# Patient Record
Sex: Female | Born: 1980 | Race: White | Hispanic: No | Marital: Single | Smoking: Former smoker
Health system: Southern US, Community
[De-identification: ages and names within clinical notes are randomized; demographics above are authoritative.]

## PROBLEM LIST (undated history)

## (undated) DIAGNOSIS — I10 Essential (primary) hypertension: Secondary | ICD-10-CM

---
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
## 2002-09-30 ENCOUNTER — Emergency Department (HOSPITAL_COMMUNITY): Admission: EM | Admit: 2002-09-30 | Discharge: 2002-09-30 | Payer: Self-pay | Admitting: Emergency Medicine

## 2003-02-19 ENCOUNTER — Emergency Department (HOSPITAL_COMMUNITY): Admission: EM | Admit: 2003-02-19 | Discharge: 2003-02-19 | Payer: Self-pay | Admitting: Emergency Medicine

## 2003-02-19 ENCOUNTER — Encounter: Payer: Self-pay | Admitting: Emergency Medicine

## 2003-04-17 ENCOUNTER — Emergency Department (HOSPITAL_COMMUNITY): Admission: EM | Admit: 2003-04-17 | Discharge: 2003-04-17 | Payer: Self-pay | Admitting: *Deleted

## 2003-07-12 ENCOUNTER — Emergency Department (HOSPITAL_COMMUNITY): Admission: EM | Admit: 2003-07-12 | Discharge: 2003-07-12 | Payer: Self-pay | Admitting: Emergency Medicine

## 2003-09-16 ENCOUNTER — Emergency Department (HOSPITAL_COMMUNITY): Admission: EM | Admit: 2003-09-16 | Discharge: 2003-09-16 | Payer: Self-pay | Admitting: Emergency Medicine

## 2003-10-05 ENCOUNTER — Emergency Department (HOSPITAL_COMMUNITY): Admission: EM | Admit: 2003-10-05 | Discharge: 2003-10-05 | Payer: Self-pay | Admitting: Emergency Medicine

## 2003-10-07 ENCOUNTER — Emergency Department (HOSPITAL_COMMUNITY): Admission: EM | Admit: 2003-10-07 | Discharge: 2003-10-07 | Payer: Self-pay | Admitting: Emergency Medicine

## 2003-11-02 ENCOUNTER — Emergency Department (HOSPITAL_COMMUNITY): Admission: EM | Admit: 2003-11-02 | Discharge: 2003-11-02 | Payer: Self-pay | Admitting: Emergency Medicine

## 2004-04-02 ENCOUNTER — Emergency Department (HOSPITAL_COMMUNITY): Admission: EM | Admit: 2004-04-02 | Discharge: 2004-04-02 | Payer: Self-pay | Admitting: *Deleted

## 2004-05-09 ENCOUNTER — Inpatient Hospital Stay (HOSPITAL_COMMUNITY): Admission: AD | Admit: 2004-05-09 | Discharge: 2004-05-09 | Payer: Self-pay | Admitting: *Deleted

## 2004-05-14 ENCOUNTER — Inpatient Hospital Stay (HOSPITAL_COMMUNITY): Admission: AD | Admit: 2004-05-14 | Discharge: 2004-05-14 | Payer: Self-pay | Admitting: Obstetrics and Gynecology

## 2015-04-09 ENCOUNTER — Encounter (HOSPITAL_COMMUNITY): Payer: Self-pay | Admitting: *Deleted

## 2015-04-09 ENCOUNTER — Telehealth (HOSPITAL_COMMUNITY): Payer: Self-pay

## 2015-04-09 ENCOUNTER — Emergency Department (HOSPITAL_COMMUNITY)
Admission: EM | Admit: 2015-04-09 | Discharge: 2015-04-09 | Disposition: A | Discharge status: Home / Self Care | Payer: Medicaid - Out of State | Attending: Emergency Medicine | Admitting: Emergency Medicine

## 2015-04-09 ENCOUNTER — Emergency Department (HOSPITAL_COMMUNITY)
Admission: EM | Admit: 2015-04-09 | Discharge: 2015-04-09 | Disposition: A | Discharge status: Home / Self Care | Payer: Medicaid - Out of State | Source: Home / Self Care

## 2015-04-09 DIAGNOSIS — I10 Essential (primary) hypertension: Secondary | ICD-10-CM | POA: Diagnosis not present

## 2015-04-09 DIAGNOSIS — R220 Localized swelling, mass and lump, head: Secondary | ICD-10-CM | POA: Diagnosis present

## 2015-04-09 DIAGNOSIS — Z720 Tobacco use: Secondary | ICD-10-CM | POA: Insufficient documentation

## 2015-04-09 DIAGNOSIS — K130 Diseases of lips: Secondary | ICD-10-CM

## 2015-04-09 HISTORY — DX: Essential (primary) hypertension: I10

## 2015-04-09 LAB — CBC WITH DIFFERENTIAL/PLATELET
Basophils Absolute: 0 10*3/uL (ref 0.0–0.1)
Basophils Relative: 0 % (ref 0–1)
EOS ABS: 0 10*3/uL (ref 0.0–0.7)
EOS PCT: 0 % (ref 0–5)
HEMATOCRIT: 47.3 % — AB (ref 36.0–46.0)
HEMOGLOBIN: 15.9 g/dL — AB (ref 12.0–15.0)
Lymphocytes Relative: 21 % (ref 12–46)
Lymphs Abs: 3.3 10*3/uL (ref 0.7–4.0)
MCH: 30.3 pg (ref 26.0–34.0)
MCHC: 33.6 g/dL (ref 30.0–36.0)
MCV: 90.3 fL (ref 78.0–100.0)
MONOS PCT: 5 % (ref 3–12)
Monocytes Absolute: 0.9 10*3/uL (ref 0.1–1.0)
Neutro Abs: 11.6 10*3/uL — ABNORMAL HIGH (ref 1.7–7.7)
Neutrophils Relative %: 74 % (ref 43–77)
Platelets: 324 10*3/uL (ref 150–400)
RBC: 5.24 MIL/uL — ABNORMAL HIGH (ref 3.87–5.11)
RDW: 13.4 % (ref 11.5–15.5)
WBC: 15.9 10*3/uL — ABNORMAL HIGH (ref 4.0–10.5)

## 2015-04-09 LAB — COMPREHENSIVE METABOLIC PANEL
ALBUMIN: 3.9 g/dL (ref 3.5–5.2)
ALK PHOS: 69 U/L (ref 39–117)
ALT: 10 U/L (ref 0–35)
ANION GAP: 11 (ref 5–15)
AST: 11 U/L (ref 0–37)
BUN: 7 mg/dL (ref 6–23)
CHLORIDE: 99 mmol/L (ref 96–112)
CO2: 27 mmol/L (ref 19–32)
Calcium: 10.2 mg/dL (ref 8.4–10.5)
Creatinine, Ser: 0.81 mg/dL (ref 0.50–1.10)
GFR calc Af Amer: >90 mL/min (ref >90)
GFR calc non Af Amer: >90 mL/min (ref >90)
Glucose, Bld: 125 mg/dL — ABNORMAL HIGH (ref 70–99)
Potassium: 5.1 mmol/L (ref 3.5–5.1)
Sodium: 137 mmol/L (ref 135–145)
TOTAL PROTEIN: 8.7 g/dL — AB (ref 6.0–8.3)
Total Bilirubin: 0.7 mg/dL (ref 0.3–1.2)

## 2015-04-09 LAB — I-STAT CG4 LACTIC ACID, ED: Lactic Acid, Venous: 1.43 mmol/L (ref 0.5–2.0)

## 2015-04-09 MED ORDER — OXYCODONE-ACETAMINOPHEN 5-325 MG PO TABS
1.0000 | ORAL_TABLET | Freq: Once | ORAL | Status: AC
  Administered 2015-04-09: 1 via ORAL

## 2015-04-09 MED ORDER — LIDOCAINE HCL (PF) 1 % IJ SOLN
5.0000 mL | Freq: Once | INTRAMUSCULAR | Status: AC
  Administered 2015-04-09: 5 mL
  Filled 2015-04-09: qty 5

## 2015-04-09 MED ORDER — CLINDAMYCIN HCL 150 MG PO CAPS: 300.0000 mg | ORAL_CAPSULE | Freq: Three times a day (TID) | ORAL | Status: DC

## 2015-04-09 MED ORDER — OXYCODONE-ACETAMINOPHEN 5-325 MG PO TABS: 2.0000 | ORAL_TABLET | Freq: Four times a day (QID) | ORAL | Status: DC | PRN

## 2015-04-09 MED ORDER — CLINDAMYCIN HCL 300 MG PO CAPS
300.0000 mg | ORAL_CAPSULE | Freq: Once | ORAL | Status: AC
  Administered 2015-04-09: 300 mg via ORAL
  Filled 2015-04-09: qty 1
  Filled 2015-04-09: qty 2

## 2015-04-09 MED ORDER — OXYCODONE-ACETAMINOPHEN 5-325 MG PO TABS
ORAL_TABLET | ORAL | Status: AC
  Filled 2015-04-09: qty 1

## 2015-04-09 NOTE — Telephone Encounter (Signed)
Pt calling took Rx for Clindamycin and Percocet to [facility] [phone] to be filled but pharmacy closed before she picked med up and [phone].  Informed pt I could call Clindamycin in but unable to call Percocet in [facility]  [facility] Rx for Cl[facility]amycin but fill Percocet Rx.

## 2015-04-09 NOTE — ED Notes (Signed)
Pt stable, ambulatory, family at bedside, pain decreased to 5/10, states understanding of discharge instructions

## 2015-04-09 NOTE — ED Notes (Signed)
Pt reports possible spider bite to lower lip. Having moderate swelling x 4 days. Reports pain, had fever on day one. Airway intact at triage.

## 2015-04-09 NOTE — Discharge Instructions (Signed)

## 2015-04-09 NOTE — ED Provider Notes (Signed)
CSN: [id number]     Arrival date & time 04/09/15  1341 History   First MD Initiated Contact with Patient 04/09/15 1622     Chief Complaint  Patient presents with  . Abscess  . Oral Swelling     (Consider location/radiation/quality/duration/timing/severity/associated sxs/prior Treatment) HPI Comments: Patient presents to the emergency department with chief complaint of lip swelling. It is the lower lip only. Patient reports that she noticed a white head near her lip the other day. She took a picture of this and appears improved today. She reports associated pain. She has not taken anything to alleviate the symptoms. The pain is moderate. She denies any fevers or chills. Denies nausea, vomiting, diarrhea. It is aggravated with palpation and movement.  The history is provided by the patient. No language interpreter was used.    Past Medical History  Diagnosis Date  . Hypertension    History reviewed. No pertinent past surgical history. History reviewed. No pertinent family history. History  Substance Use Topics  . Smoking status: Current Every Day Smoker -- 2.00 packs/day    Types: Cigarettes  . Smokeless tobacco: Not on file  . Alcohol Use: No   OB History    No data available     Review of Systems  Constitutional: Negative for fever and chills.  Respiratory: Negative for shortness of breath.   Cardiovascular: Negative for chest pain.  Gastrointestinal: Negative for nausea, vomiting, diarrhea and constipation.  Genitourinary: Negative for dysuria.  Skin:       Mild swelling of lower lip with very small comedone/pustule  All other systems reviewed and are negative.     Allergies  Geodon and Bactrim  Home Medications   Prior to Admission medications   Medication Sig Start Date End Date Taking? Authorizing Provider  ibuprofen (ADVIL,MOTRIN) 200 MG tablet Take 200 mg by mouth every 6 (six) hours as needed for moderate pain.   Yes Historical Provider, MD   neomycin-bacitracin-polymyxin (NEOSPORIN) OINT Apply 1 application topically as needed for irritation or wound care.   Yes Historical Provider, MD  vitamin C (ASCORBIC ACID) 500 MG tablet Take 500 mg by mouth as needed (for immune support).   Yes Historical Provider, MD   BP 106/71 mmHg  Pulse 89  Temp(Src) 98.6 F (37 C) (Oral)  Resp 16  Ht 5\' 3"  (1.6 m)  Wt 113 lb (51.256 kg)  BMI 20.02 kg/m2  SpO2 99% Physical Exam  Constitutional: She is oriented to person, place, and time. She appears well-developed and well-nourished.  HENT:  Head: Normocephalic and atraumatic.  Mild lower lip swelling with a small comedone/pustule, no tongue involvement, no Ludwig angina, no cellulitis of the face  Eyes: Conjunctivae and EOM are normal. Pupils are equal, round, and reactive to light.  Neck: Normal range of motion. Neck supple.  Cardiovascular: Normal rate and regular rhythm.  Exam reveals no gallop and no friction rub.   No murmur heard. Pulmonary/Chest: Effort normal and breath sounds normal. No respiratory distress. She has no wheezes. She has no rales. She exhibits no tenderness.  Abdominal: Soft. Bowel sounds are normal. She exhibits no distension and no mass. There is no tenderness. There is no rebound and no guarding.  Musculoskeletal: Normal range of motion. She exhibits no edema or tenderness.  Neurological: She is alert and oriented to person, place, and time.  Skin: Skin is warm and dry.  Psychiatric: She has a normal mood and affect. Her behavior is normal. Judgment and thought content normal.  Nursing note and vitals reviewed.   ED Course  Procedures (including critical care time) Results for orders placed or performed during the hospital encounter of 04/09/15  Comprehensive metabolic panel  Result Value Ref Range   Sodium 137 135 - 145 mmol/L   Potassium 5.1 3.5 - 5.1 mmol/L   Chloride 99 96 - 112 mmol/L   CO2 27 19 - 32 mmol/L   Glucose, Bld 125 (H) 70 - 99 mg/dL   BUN 7  6 - 23 mg/dL   Creatinine, Ser 4.01 0.50 - 1.10 mg/dL   Calcium 02.7 8.4 - 25.3 mg/dL   Total Protein 8.7 (H) 6.0 - 8.3 g/dL   Albumin 3.9 3.5 - 5.2 g/dL   AST 11 0 - 37 U/L   ALT 10 0 - 35 U/L   Alkaline Phosphatase 69 39 - 117 U/L   Total Bilirubin 0.7 0.3 - 1.2 mg/dL   GFR calc non Af Amer >90 >90 mL/min   GFR calc Af Amer >90 >90 mL/min   Anion gap 11 5 - 15  CBC with Differential  Result Value Ref Range   WBC 15.9 (H) 4.0 - 10.5 K/uL   RBC 5.24 (H) 3.87 - 5.11 MIL/uL   Hemoglobin 15.9 (H) 12.0 - 15.0 g/dL   HCT 66.4 (H) 40.3 - 47.4 %   MCV 90.3 78.0 - 100.0 fL   MCH 30.3 26.0 - 34.0 pg   MCHC 33.6 30.0 - 36.0 g/dL   RDW 25.9 56.3 - 87.5 %   Platelets 324 150 - 400 K/uL   Neutrophils Relative % 74 43 - 77 %   Neutro Abs 11.6 (H) 1.7 - 7.7 K/uL   Lymphocytes Relative 21 12 - 46 %   Lymphs Abs 3.3 0.7 - 4.0 K/uL   Monocytes Relative 5 3 - 12 %   Monocytes Absolute 0.9 0.1 - 1.0 K/uL   Eosinophils Relative 0 0 - 5 %   Eosinophils Absolute 0.0 0.0 - 0.7 K/uL   Basophils Relative 0 0 - 1 %   Basophils Absolute 0.0 0.0 - 0.1 K/uL  I-Stat CG4 Lactic Acid, ED (Not at [facility] or [facility])  Result Value Ref Range   Lactic Acid, Venous 1.43 0.5 - 2.0 mmol/L   No results found.    EKG Interpretation None      MDM   Final diagnoses:  Lip abscess    Patient with lower lip swelling and possible resolving abscess. Patient's mother has a picture from 2 days ago which is significantly worse than today. The patient's course seems to be improving. Will recommend continuing warm compresses and will have spent antibiotics. At this time, shared decision-making was used to defer incision and drainage unless the swelling worsens. Patient understands and agrees to plan. Discharge home with clindamycin. Patient is stable and ready for discharge. Return precautions given.    Roxy Horseman, PA-C 04/09/15 1724  Dione Booze, MD 04/09/15 [phone]

## 2015-11-23 ENCOUNTER — Emergency Department (HOSPITAL_COMMUNITY)
Admission: EM | Admit: 2015-11-23 | Discharge: 2015-11-23 | Disposition: A | Discharge status: Home / Self Care | Payer: Medicaid - Out of State | Attending: Emergency Medicine | Admitting: Emergency Medicine

## 2015-11-23 ENCOUNTER — Encounter (HOSPITAL_COMMUNITY): Payer: Self-pay | Admitting: Emergency Medicine

## 2015-11-23 DIAGNOSIS — F17210 Nicotine dependence, cigarettes, uncomplicated: Secondary | ICD-10-CM | POA: Insufficient documentation

## 2015-11-23 DIAGNOSIS — Z3202 Encounter for pregnancy test, result negative: Secondary | ICD-10-CM | POA: Insufficient documentation

## 2015-11-23 DIAGNOSIS — I10 Essential (primary) hypertension: Secondary | ICD-10-CM | POA: Insufficient documentation

## 2015-11-23 DIAGNOSIS — M549 Dorsalgia, unspecified: Secondary | ICD-10-CM

## 2015-11-23 DIAGNOSIS — M545 Low back pain: Secondary | ICD-10-CM | POA: Insufficient documentation

## 2015-11-23 LAB — URINALYSIS, ROUTINE W REFLEX MICROSCOPIC
Bilirubin Urine: NEGATIVE
Glucose, UA: NEGATIVE mg/dL
KETONES UR: NEGATIVE mg/dL
LEUKOCYTES UA: NEGATIVE
Nitrite: NEGATIVE
PROTEIN: NEGATIVE mg/dL
Specific Gravity, Urine: 1.015 (ref 1.005–1.030)
pH: 7 (ref 5.0–8.0)

## 2015-11-23 LAB — URINE MICROSCOPIC-ADD ON

## 2015-11-23 LAB — POC URINE PREG, ED: PREG TEST UR: NEGATIVE

## 2015-11-23 MED ORDER — METHOCARBAMOL 500 MG PO TABS
500.0000 mg | ORAL_TABLET | Freq: Two times a day (BID) | ORAL | Status: DC
Start: 2015-11-23 — End: 2020-05-09

## 2015-11-23 MED ORDER — NAPROXEN 500 MG PO TABS: 500.0000 mg | ORAL_TABLET | Freq: Two times a day (BID) | ORAL | Status: DC

## 2015-11-23 NOTE — ED Notes (Signed)
Patient comes in with complaints of back pain. States pain started last week. . Patient states she is moving and have been picking up heavy boxes. Patient denies any changes to bowel or bladder. Patient ambulatory and able to move all extremities. Patient rates pain 8/10

## 2015-11-23 NOTE — Discharge Instructions (Signed)
Take the prescribed medication as directed.  Your back pain is likely due to lifting/moving boxes last week. Urine did not show any signs of infection. Follow-up with women's clinic to establish with new OB-GYN. Return to the ED for new or worsening symptoms.  Back Pain, Adult Back pain is very common in adults.The cause of back pain is rarely dangerous and the pain often gets better over time.The cause of your back pain may not be known. Some common causes of back pain include:  Strain of the muscles or ligaments supporting the spine.  Wear and tear (degeneration) of the spinal disks.  Arthritis.  Direct injury to the back. For many people, back pain may return. Since back pain is rarely dangerous, most people can learn to manage this condition on their own. HOME CARE INSTRUCTIONS Watch your back pain for any changes. The following actions may help to lessen any discomfort you are feeling:  Remain active. It is stressful on your back to sit or stand in one place for long periods of time. Do not sit, drive, or stand in one place for more than 30 minutes at a time. Take short walks on even surfaces as soon as you are able.Try to increase the length of time you walk each day.  Exercise regularly as directed by your health care provider. Exercise helps your back heal faster. It also helps avoid future injury by keeping your muscles strong and flexible.  Do not stay in bed.Resting more than 1-2 days can delay your recovery.  Pay attention to your body when you bend and lift. The most comfortable positions are those that put less stress on your recovering back. Always use proper lifting techniques, including:  Bending your knees.  Keeping the load close to your body.  Avoiding twisting.  Find a comfortable position to sleep. Use a firm mattress and lie on your side with your knees slightly bent. If you lie on your back, put a pillow under your knees.  Avoid feeling anxious or  stressed.Stress increases muscle tension and can worsen back pain.It is important to recognize when you are anxious or stressed and learn ways to manage it, such as with exercise.  Take medicines only as directed by your health care provider. Over-the-counter medicines to reduce pain and inflammation are often the most helpful.Your health care provider may prescribe muscle relaxant drugs.These medicines help dull your pain so you can more quickly return to your normal activities and healthy exercise.  Apply ice to the injured area:  Put ice in a plastic bag.  Place a towel between your skin and the bag.  Leave the ice on for 20 minutes, 2-3 times a day for the first 2-3 days. After that, ice and heat may be alternated to reduce pain and spasms.  Maintain a healthy weight. Excess weight puts extra stress on your back and makes it difficult to maintain good posture. SEEK MEDICAL CARE IF:  You have pain that is not relieved with rest or medicine.  You have increasing pain going down into the legs or buttocks.  You have pain that does not improve in one week.  You have night pain.  You lose weight.  You have a fever or chills. SEEK IMMEDIATE MEDICAL CARE IF:   You develop new bowel or bladder control problems.  You have unusual weakness or numbness in your arms or legs.  You develop nausea or vomiting.  You develop abdominal pain.  You feel faint.   This  information is not intended to replace advice given to you by your health care provider. Make sure you discuss any questions you have with your health care provider.   Document Released: 12/02/2005 Document Revised: 12/23/2014 Document Reviewed: 04/05/2014 Elsevier Interactive Patient Education [employer].

## 2015-11-23 NOTE — ED Provider Notes (Signed)
CSN: [id number]     Arrival date & time 11/23/15  [id number]   First MD Initiated Contact with Patient 11/23/15 [phone]     Chief Complaint  Patient presents with  . Back Pain     (Consider location/radiation/quality/duration/timing/severity/associated sxs/prior Treatment) Patient is a 34 y.o. female presenting with back pain. The history is provided by the patient and medical records.  Back Pain Associated symptoms: no dysuria      34 y.o. F with hx of HTN, presenting to the ED for back pain.  Patient states pain began yesterday afternoon and has been progressively worsening.  She states she did move into a new home approximately 1 week ago and was moving heavy boxes. States pain is mostly localized to her low back, described as dull and aching. She denies any bowel or bladder incontinence. No numbness or weakness of her lower extremities. No difficulty walking. Patient states she has had some urinary frequency but denies any dysuria or hematuria. Denies possibility of pregnancy, she is currently on her menstrual. No history of prior back injuries or surgeries. No intervention tried prior to arrival.  Past Medical History  Diagnosis Date  . Hypertension    History reviewed. No pertinent past surgical history. No family history on file. Social History  Substance Use Topics  . Smoking status: Current Every Day Smoker -- 2.00 packs/day    Types: Cigarettes  . Smokeless tobacco: None  . Alcohol Use: No   OB History    No data available     Review of Systems  Genitourinary: Positive for frequency. Negative for dysuria and flank pain.  Musculoskeletal: Positive for back pain.  All other systems reviewed and are negative.     Allergies  Geodon and Bactrim  Home Medications   Prior to Admission medications   Medication Sig Start Date End Date Taking? Authorizing Provider  clindamycin (CLEOCIN) 150 MG capsule Take 2 capsules (300 mg total) by mouth 3 (three) times daily. May  dispense as 150mg  capsules 04/09/15   Roxy Horsemanobert Browning, PA-C  ibuprofen (ADVIL,MOTRIN) 200 MG tablet Take 200 mg by mouth every 6 (six) hours as needed for moderate pain.    Historical Provider, MD  neomycin-bacitracin-polymyxin (NEOSPORIN) OINT Apply 1 application topically as needed for irritation or wound care.    Historical Provider, MD  oxyCODONE-acetaminophen (PERCOCET/ROXICET) 5-325 MG per tablet Take 2 tablets by mouth every 6 (six) hours as needed for severe pain. 04/09/15   Roxy Horsemanobert Browning, PA-C  vitamin C (ASCORBIC ACID) 500 MG tablet Take 500 mg by mouth as needed (for immune support).    Historical Provider, MD   BP 103/70 mmHg  Pulse 83  Temp(Src) 97.8 F (36.6 C) (Oral)  Resp 16  SpO2 100%   Physical Exam  Constitutional: She is oriented to person, place, and time. She appears well-developed and well-nourished. No distress.  HENT:  Head: Normocephalic and atraumatic.  Mouth/Throat: Oropharynx is clear and moist.  Eyes: Conjunctivae and EOM are normal. Pupils are equal, round, and reactive to light.  Neck: Normal range of motion. Neck supple.  Cardiovascular: Normal rate, regular rhythm and normal heart sounds.   Pulmonary/Chest: Effort normal and breath sounds normal. No respiratory distress. She has no wheezes.  Abdominal: Soft. Bowel sounds are normal. There is no tenderness. There is no guarding and no CVA tenderness.  Abdomen soft, nontender, no CVA tenderness  Musculoskeletal: Normal range of motion. She exhibits no edema.  Tenderness of lumbar paraspinal muscles bilaterally, no midline deformity  or step-off, full range of motion maintained, normal strength and sensation of bilateral lower extremities, normal gait  Neurological: She is alert and oriented to person, place, and time.  Skin: Skin is warm and dry. She is not diaphoretic.  Psychiatric: She has a normal mood and affect.  Nursing note and vitals reviewed.   ED Course  Procedures (including critical care  time) Labs Review Labs Reviewed  URINALYSIS, ROUTINE W REFLEX MICROSCOPIC (NOT AT [facility]) [facility]; Notable for the following:    APPearance HAZY (*)    Hgb urine dipstick LARGE (*)    All other components within normal limits  URINE MICROSCOPIC-ADD ON - Abnormal; Notable for the following:    Squamous Epithelial / LPF 0-5 (*)    Bacteria, UA RARE (*)    All other components within normal limits  POC URINE PREG, ED    Imaging Review No results found. I have personally reviewed and evaluated these images and lab results as part of my medical decision-making.   EKG Interpretation None      MDM   Final diagnoses:  Back pain, unspecified location   35 year old female here with back pain. She recently moved last week and was lifting heavy boxes. Patient is afebrile, nontoxic. She has tenderness of her lumbar paraspinal muscles without midline deformity. She has no focal neurologic deficits. No CVA tenderness or abdominal pain. She does have some urinary frequency, UA was obtained which is negative for infection.  There is blood noted however patient is currently on her menstrual.  No flank pain to suggest pyelonephritis or acute stone.  Pain is likely MSK in nature.  No red flags symptoms or neurologic deficits to suggest cauda equina, SCI, or other acute spinal pathology.  Patient we discharged with muscle relaxer and NSAIDs. She was also given referral to [facility] as she states she needs a new OB/GYN since moving here from [place].  Discussed plan with patient, he/she acknowledged understanding and agreed with plan of care.  Return precautions given for new or worsening symptoms.  Garlon Hatchet, PA-C 11/23/15 1119  Alvira Monday, MD 11/25/15 1204

## 2016-05-24 ENCOUNTER — Other Ambulatory Visit: Payer: Self-pay | Admitting: Licensed Clinical Social Worker

## 2016-09-22 ENCOUNTER — Emergency Department (HOSPITAL_COMMUNITY)
Admission: EM | Admit: 2016-09-22 | Discharge: 2016-09-22 | Disposition: A | Discharge status: Home / Self Care | Payer: Medicaid - Out of State | Attending: Emergency Medicine | Admitting: Emergency Medicine

## 2016-09-22 ENCOUNTER — Emergency Department (HOSPITAL_COMMUNITY): Payer: Medicaid - Out of State

## 2016-09-22 ENCOUNTER — Encounter (HOSPITAL_COMMUNITY): Payer: Self-pay | Admitting: Emergency Medicine

## 2016-09-22 DIAGNOSIS — I10 Essential (primary) hypertension: Secondary | ICD-10-CM | POA: Insufficient documentation

## 2016-09-22 DIAGNOSIS — Z79899 Other long term (current) drug therapy: Secondary | ICD-10-CM | POA: Insufficient documentation

## 2016-09-22 DIAGNOSIS — R1031 Right lower quadrant pain: Secondary | ICD-10-CM | POA: Insufficient documentation

## 2016-09-22 DIAGNOSIS — N939 Abnormal uterine and vaginal bleeding, unspecified: Secondary | ICD-10-CM

## 2016-09-22 DIAGNOSIS — F17210 Nicotine dependence, cigarettes, uncomplicated: Secondary | ICD-10-CM | POA: Insufficient documentation

## 2016-09-22 LAB — URINALYSIS, ROUTINE W REFLEX MICROSCOPIC
Bilirubin Urine: NEGATIVE
GLUCOSE, UA: NEGATIVE mg/dL
Hgb urine dipstick: NEGATIVE
KETONES UR: NEGATIVE mg/dL
LEUKOCYTES UA: NEGATIVE
NITRITE: NEGATIVE
PH: 6 (ref 5.0–8.0)
Protein, ur: NEGATIVE mg/dL
SPECIFIC GRAVITY, URINE: 1.023 (ref 1.005–1.030)

## 2016-09-22 LAB — COMPREHENSIVE METABOLIC PANEL
ALBUMIN: 3.8 g/dL (ref 3.5–5.0)
ALT: 12 U/L — ABNORMAL LOW (ref 14–54)
ANION GAP: 6 (ref 5–15)
AST: 14 U/L — AB (ref 15–41)
Alkaline Phosphatase: 53 U/L (ref 38–126)
BUN: 12 mg/dL (ref 6–20)
CHLORIDE: 107 mmol/L (ref 101–111)
CO2: 28 mmol/L (ref 22–32)
Calcium: 8.7 mg/dL — ABNORMAL LOW (ref 8.9–10.3)
Creatinine, Ser: 0.63 mg/dL (ref 0.44–1.00)
GFR calc Af Amer: >60 mL/min (ref >60)
GFR calc non Af Amer: >60 mL/min (ref >60)
GLUCOSE: 92 mg/dL (ref 65–99)
POTASSIUM: 4.1 mmol/L (ref 3.5–5.1)
SODIUM: 141 mmol/L (ref 135–145)
Total Bilirubin: 0.2 mg/dL — ABNORMAL LOW (ref 0.3–1.2)
Total Protein: 6.8 g/dL (ref 6.5–8.1)

## 2016-09-22 LAB — CBC
HEMATOCRIT: 38.7 % (ref 36.0–46.0)
HEMOGLOBIN: 13 g/dL (ref 12.0–15.0)
MCH: 31 pg (ref 26.0–34.0)
MCHC: 33.6 g/dL (ref 30.0–36.0)
MCV: 92.1 fL (ref 78.0–100.0)
Platelets: 305 10*3/uL (ref 150–400)
RBC: 4.2 MIL/uL (ref 3.87–5.11)
RDW: 14 % (ref 11.5–15.5)
WBC: 11.3 10*3/uL — ABNORMAL HIGH (ref 4.0–10.5)

## 2016-09-22 LAB — I-STAT BETA HCG BLOOD, ED (MC, WL, AP ONLY): I-stat hCG, quantitative: <5 m[IU]/mL (ref <5)

## 2016-09-22 LAB — LIPASE, BLOOD: LIPASE: 22 U/L (ref 11–51)

## 2016-09-22 NOTE — ED Notes (Signed)
Pt is aware of the need for urine sample, however states she is unable to provide one at this time.

## 2016-09-22 NOTE — ED Provider Notes (Signed)
[facility] Provider Note   CSN: [id number] Arrival date & time: 09/22/16  1331     History   Chief Complaint Chief Complaint  Patient presents with  . Abdominal Pain  . Vaginal Bleeding    HPI Felicia Costa is a 35 y.o. female.  HPI Patient's had vaginal bleeding for the last year. For the last few months she has had pain in her lower abdomen and to the right side. She's had nausea since. States years ago she had a LEEP but also states that she had lesions frozen off. States her vaginal bleeding has been heavier than a period. Occasional lightheadedness. The pain is dull and constant. No weight loss. Denies other vaginal discharge.  Past Medical History:  Diagnosis Date  . Hypertension     There are no active problems to display for this patient.   History reviewed. No pertinent surgical history.  OB History    No data available       Home Medications    Prior to Admission medications   Medication Sig Start Date End Date Taking? Authorizing Provider  gabapentin (NEURONTIN) 600 MG tablet Take 600 mg by mouth 3 (three) times daily.   Yes Historical Provider, MD  ibuprofen (ADVIL,MOTRIN) 200 MG tablet Take 800 mg by mouth every 6 (six) hours as needed for moderate pain.    Yes Historical Provider, MD  clindamycin (CLEOCIN) 150 MG capsule Take 2 capsules (300 mg total) by mouth 3 (three) times daily. May dispense as 150mg  capsules Patient not taking: Reported on 11/23/2015 04/09/15   Roxy Horsemanobert Browning, PA-C  methocarbamol (ROBAXIN) 500 MG tablet Take 1 tablet (500 mg total) by mouth 2 (two) times daily. Patient not taking: Reported on 09/22/2016 11/23/15   Garlon HatchetLisa M Sanders, PA-C  naproxen (NAPROSYN) 500 MG tablet Take 1 tablet (500 mg total) by mouth 2 (two) times daily with a meal. Patient not taking: Reported on 09/22/2016 11/23/15   Garlon HatchetLisa M Sanders, PA-C  oxyCODONE-acetaminophen (PERCOCET/ROXICET) 5-325 MG per tablet Take 2 tablets by mouth every 6 (six) hours as  needed for severe pain. Patient not taking: Reported on 11/23/2015 04/09/15   Roxy Horsemanobert Browning, PA-C    Family History History reviewed. No pertinent family history.  Social History Social History  Substance Use Topics  . Smoking status: Current Every Day Smoker    Packs/day: 2.00    Types: Cigarettes  . Smokeless tobacco: Not on file  . Alcohol use No     Allergies   Geodon [ziprasidone hcl] and Bactrim [sulfamethoxazole-trimethoprim]   Review of Systems Review of Systems  Constitutional: Negative for appetite change.  HENT: Negative for congestion.   Respiratory: Negative for chest tightness and shortness of breath.   Gastrointestinal: Positive for abdominal pain. Negative for blood in stool.  Genitourinary: Negative for vaginal bleeding.  Skin: Negative for rash.  Hematological: Negative for adenopathy.     Physical Exam Updated Vital Signs BP (!) 106/50 (BP Location: Right Arm)   Pulse 97   Temp 98.8 F (37.1 C) (Oral)   Resp 18   Ht 5\' 3"  (1.6 m)   Wt 136 lb (61.7 kg)   SpO2 96%   BMI 24.09 kg/m   Physical Exam  Constitutional: She appears well-developed.  Neck: Neck supple.  Cardiovascular: Normal rate.   Pulmonary/Chest: Effort normal.  Abdominal:  Some suprapubic tenderness. Possible fullness in right lower quadrant.  Musculoskeletal: Normal range of motion.  Neurological: She is alert.  Skin: No pallor.  Psychiatric: She  has a normal mood and affect.     ED Treatments / Results  Labs (all labs ordered are listed, but only abnormal results are displayed) Labs Reviewed  COMPREHENSIVE METABOLIC PANEL - Abnormal; Notable for the following:       Result Value   Calcium 8.7 (*)    AST 14 (*)    ALT 12 (*)    Total Bilirubin 0.2 (*)    All other components within normal limits  CBC - Abnormal; Notable for the following:    WBC 11.3 (*)    All other components within normal limits  LIPASE, BLOOD  URINALYSIS, ROUTINE W REFLEX MICROSCOPIC (NOT  AT [facility])  RPR  HIV ANTIBODY (ROUTINE TESTING)  I-STAT BETA HCG BLOOD, ED ([facility], [facility], [facility] ONLY)  GC/CHLAMYDIA PROBE AMP ([place]) NOT AT [facility]    EKG  EKG Interpretation None       Radiology US Transvaginal Non-ob  Result Date: 09/22/2016 CLINICAL DATA:  Right lower quadrant pain. Continuous vaginal bleeding for 1 year. EXAM: TRANSABDOMINAL AND TRANSVAGINAL ULTRASOUND OF PELVIS DOPPLER ULTRASOUND OF OVARIES TECHNIQUE: Both transabdominal and transvaginal ultrasound examinations of the pelvis were performed. Transabdominal technique was performed for global imaging of the pelvis including uterus, ovaries, adnexal regions, and pelvic cul-de-sac. It was necessary to proceed with endovaginal exam following the transabdominal exam to visualize the ovaries and endometrium. Color and duplex Doppler ultrasound was utilized to evaluate blood flow to the ovaries. COMPARISON:  None. FINDINGS: Uterus Measurements: 7.8 x 4.4 x 6.7 cm. No fibroids or other mass visualized. Endometrium Thickness: 1.6 mm.  No focal abnormality visualized. Right ovary Measurements: 3.2 x 2.2 x 2.3 cm. Normal appearance/no adnexal mass. Left ovary Measurements: 3.5 x 3.0 x 3.0 cm. Dominant 2.7 cm follicle. Pulsed Doppler evaluation of both ovaries demonstrates normal low-resistance arterial and venous waveforms. Other findings Trace fluid in the left adnexum, likely physiologic. IMPRESSION: 1. Dominant follicle in the left ovary. Minimal fluid in left adnexum, likely physiologic. No cause for right-sided pain identified. Electronically Signed   By: Gerome Sam III M.D   On: 09/22/2016 16:21   US Pelvis Complete  Result Date: 09/22/2016 CLINICAL DATA:  Right lower quadrant pain. Continuous vaginal bleeding for 1 year. EXAM: TRANSABDOMINAL AND TRANSVAGINAL ULTRASOUND OF PELVIS DOPPLER ULTRASOUND OF OVARIES TECHNIQUE: Both transabdominal and transvaginal ultrasound examinations of the pelvis were performed. Transabdominal  technique was performed for global imaging of the pelvis including uterus, ovaries, adnexal regions, and pelvic cul-de-sac. It was necessary to proceed with endovaginal exam following the transabdominal exam to visualize the ovaries and endometrium. Color and duplex Doppler ultrasound was utilized to evaluate blood flow to the ovaries. COMPARISON:  None. FINDINGS: Uterus Measurements: 7.8 x 4.4 x 6.7 cm. No fibroids or other mass visualized. Endometrium Thickness: 1.6 mm.  No focal abnormality visualized. Right ovary Measurements: 3.2 x 2.2 x 2.3 cm. Normal appearance/no adnexal mass. Left ovary Measurements: 3.5 x 3.0 x 3.0 cm. Dominant 2.7 cm follicle. Pulsed Doppler evaluation of both ovaries demonstrates normal low-resistance arterial and venous waveforms. Other findings Trace fluid in the left adnexum, likely physiologic. IMPRESSION: 1. Dominant follicle in the left ovary. Minimal fluid in left adnexum, likely physiologic. No cause for right-sided pain identified. Electronically Signed   By: Gerome Sam III M.D   On: 09/22/2016 16:21   [place] Art/ven Flow Abd Pelv Doppler  Result Date: 09/22/2016 CLINICAL DATA:  Right lower quadrant pain. Continuous vaginal bleeding for 1 year. EXAM: TRANSABDOMINAL AND TRANSVAGINAL ULTRASOUND  OF PELVIS DOPPLER ULTRASOUND OF OVARIES TECHNIQUE: Both transabdominal and transvaginal ultrasound examinations of the pelvis were performed. Transabdominal technique was performed for global imaging of the pelvis including uterus, ovaries, adnexal regions, and pelvic cul-de-sac. It was necessary to proceed with endovaginal exam following the transabdominal exam to visualize the ovaries and endometrium. Color and duplex Doppler ultrasound was utilized to evaluate blood flow to the ovaries. COMPARISON:  None. FINDINGS: Uterus Measurements: 7.8 x 4.4 x 6.7 cm. No fibroids or other mass visualized. Endometrium Thickness: 1.6 mm.  No focal abnormality visualized. Right ovary Measurements:  3.2 x 2.2 x 2.3 cm. Normal appearance/no adnexal mass. Left ovary Measurements: 3.5 x 3.0 x 3.0 cm. Dominant 2.7 cm follicle. Pulsed Doppler evaluation of both ovaries demonstrates normal low-resistance arterial and venous waveforms. Other findings Trace fluid in the left adnexum, likely physiologic. IMPRESSION: 1. Dominant follicle in the left ovary. Minimal fluid in left adnexum, likely physiologic. No cause for right-sided pain identified. Electronically Signed   By: Gerome Sam III M.D   On: 09/22/2016 16:21    Procedures Procedures (including critical care time)  Medications Ordered in ED Medications - No data to display   Initial Impression / Assessment and Plan / ED Course  I have reviewed the triage vital signs and the nursing notes.  Pertinent labs & imaging results that were available during my care of the patient were reviewed by me and considered in my medical decision making (see chart for details).  Clinical Course    Patient with vaginal bleeding and abdominal pain. Has had for several months to year. Ultrasound reassuring. Lab work overall reassuring also. Not pregnant. Patient refused pelvic exam states she just wanted to leave. States she will follow-up with the women's clinic. White count only mildly elevated. Discharge home.  Final Clinical Impressions(s) / ED Diagnoses   Final diagnoses:  Vaginal bleeding  Right lower quadrant abdominal pain    New Prescriptions New Prescriptions   No medications on file     Benjiman Core, MD 09/22/16 1652

## 2016-09-22 NOTE — ED Triage Notes (Signed)
Pt c/o progressive RLQ abdominal pain radiating into right flank x several months, nausea, continuous periods x 1 year. Hx LEEP.

## 2016-09-22 NOTE — ED Notes (Signed)
PT REFUSED PELVIC EXAM.

## 2016-09-22 NOTE — ED Notes (Signed)
Pt states that she does not want additional labs drawn because she was already tested for HIV and STDs in March

## 2016-09-22 NOTE — ED Notes (Signed)
Pt noted to be walking around the department looking for the way out. I instructed the pt to remain her room to wait for discharge paperwork however she stated "He already came in and told me everything I need to know, we are leaving". Pt exited the department

## 2018-02-02 DIAGNOSIS — F1190 Opioid use, unspecified, uncomplicated: Secondary | ICD-10-CM | POA: Insufficient documentation

## 2018-02-04 ENCOUNTER — Other Ambulatory Visit (HOSPITAL_COMMUNITY): Payer: Self-pay

## 2018-02-04 DIAGNOSIS — N644 Mastodynia: Secondary | ICD-10-CM

## 2018-03-05 ENCOUNTER — Ambulatory Visit: Payer: Medicaid - Out of State

## 2018-03-05 ENCOUNTER — Ambulatory Visit (HOSPITAL_COMMUNITY)
Admission: RE | Admit: 2018-03-05 | Discharge: 2018-03-05 | Disposition: A | Discharge status: Home / Self Care | Payer: Self-pay | Source: Ambulatory Visit | Attending: Obstetrics and Gynecology | Admitting: Obstetrics and Gynecology

## 2018-03-05 ENCOUNTER — Ambulatory Visit
Admission: RE | Admit: 2018-03-05 | Discharge: 2018-03-05 | Disposition: A | Discharge status: Home / Self Care | Payer: Medicaid - Out of State | Source: Ambulatory Visit | Attending: Obstetrics and Gynecology | Admitting: Obstetrics and Gynecology

## 2018-03-05 DIAGNOSIS — N644 Mastodynia: Secondary | ICD-10-CM

## 2018-03-05 NOTE — Progress Notes (Signed)
Patient stopped drinking caffeine around 22 days ago and is no longer having breast pain. Patient denied having any problems with her breast today. Patient not eligible for BCCCP due to being under 40 with no breast symptoms. Told patient to call if pain comes back or if she develops any breast symptoms.

## 2018-07-08 ENCOUNTER — Encounter (HOSPITAL_COMMUNITY): Payer: Self-pay | Admitting: Emergency Medicine

## 2018-07-08 ENCOUNTER — Ambulatory Visit (HOSPITAL_COMMUNITY)
Admission: EM | Admit: 2018-07-08 | Discharge: 2018-07-08 | Disposition: A | Discharge status: Home / Self Care | Payer: No Typology Code available for payment source | Attending: Family Medicine | Admitting: Family Medicine

## 2018-07-08 ENCOUNTER — Other Ambulatory Visit: Payer: Self-pay

## 2018-07-08 DIAGNOSIS — M79675 Pain in left toe(s): Secondary | ICD-10-CM

## 2018-07-08 DIAGNOSIS — L089 Local infection of the skin and subcutaneous tissue, unspecified: Secondary | ICD-10-CM

## 2018-07-08 DIAGNOSIS — L600 Ingrowing nail: Secondary | ICD-10-CM

## 2018-07-08 MED ORDER — CEPHALEXIN 500 MG PO CAPS: 500.0000 mg | ORAL_CAPSULE | Freq: Four times a day (QID) | ORAL | 0 refills | Status: DC

## 2018-07-08 MED ORDER — NAPROXEN 500 MG PO TABS: 500.0000 mg | ORAL_TABLET | Freq: Two times a day (BID) | ORAL | 0 refills | Status: DC

## 2018-07-08 NOTE — ED Triage Notes (Signed)
Left great toe pain for 6-8 months ago.  Ingrown toenail.

## 2018-07-08 NOTE — Discharge Instructions (Addendum)
It was nice meeting you!!  I am treating you for the infection in your toe.  Please keep you appointment with the podiatrist to have the ingrown toenail removed.  Continue the Epson salt soaks as you have been .  Return as needed.

## 2018-07-08 NOTE — ED Provider Notes (Signed)
[facility]    CSN: [id number] Arrival date & time: 07/08/18  1601     History   Chief Complaint Chief Complaint  Patient presents with  . Toe Pain    HPI Felicia Costa is a 37 y.o. female.   Patient is a 37 year old female that presents with 1 week of left great toe pain and swelling.  She has had ongoing issues with that toe for many months due to ingrown toenail.  The toe is very painful and swell swollen.  She reports some drainage from the toe at times.  She has been using Epson salt soaks.  She has an appointment with the podiatrist in 1 month.      Past Medical History:  Diagnosis Date  . Hypertension     There are no active problems to display for this patient.   History reviewed. No pertinent surgical history.  OB History   None      Home Medications    Prior to Admission medications   Medication Sig Start Date End Date Taking? Authorizing Provider  ibuprofen (ADVIL,MOTRIN) 200 MG tablet Take 800 mg by mouth every 6 (six) hours as needed for moderate pain.    Yes [provider]  propranolol (INDERAL) 10 MG tablet Take 10 mg by mouth 3 (three) times daily.   Yes [provider]  cephALEXin (KEFLEX) 500 MG capsule Take 1 capsule (500 mg total) by mouth 4 (four) times daily. 07/08/18   Dahlia ByesBast, Tahnee Cifuentes A, NP  clindamycin (CLEOCIN) 150 MG capsule Take 2 capsules (300 mg total) by mouth 3 (three) times daily. May dispense as 150mg  capsules Patient not taking: Reported on 11/23/2015 04/09/15   Roxy HorsemanBrowning, Robert, PA-C  gabapentin (NEURONTIN) 600 MG tablet Take 600 mg by mouth 3 (three) times daily.    [provider]  methocarbamol (ROBAXIN) 500 MG tablet Take 1 tablet (500 mg total) by mouth 2 (two) times daily. Patient not taking: Reported on 09/22/2016 11/23/15   Garlon HatchetSanders, Lisa M, PA-C  naproxen (NAPROSYN) 500 MG tablet Take 1 tablet (500 mg total) by mouth 2 (two) times daily with a meal. 07/08/18   Evann Erazo A, NP    oxyCODONE-acetaminophen (PERCOCET/ROXICET) 5-325 MG per tablet Take 2 tablets by mouth every 6 (six) hours as needed for severe pain. Patient not taking: Reported on 11/23/2015 04/09/15   Roxy HorsemanBrowning, Robert, PA-C    Family History Family History  Adopted: Yes  Family history unknown: Yes    Social History Social History   Tobacco Use  . Smoking status: Former Smoker    Packs/day: 2.00    Types: Cigarettes  Substance Use Topics  . Alcohol use: No  . Drug use: No     Allergies   Geodon [ziprasidone hcl] and Bactrim [sulfamethoxazole-trimethoprim]   Review of Systems Review of Systems  Constitutional: Negative for fatigue and fever.  Skin: Positive for rash.  Allergic/Immunologic: Negative for immunocompromised state.  Hematological: Does not bruise/bleed easily.     Physical Exam Triage Vital Signs ED Triage Vitals  Enc Vitals Group     BP 07/08/18 1633 (!) 122/58     Pulse Rate 07/08/18 1633 63     Resp 07/08/18 1633 18     Temp 07/08/18 1633 98.2 F (36.8 C)     Temp Source 07/08/18 1633 Oral     SpO2 07/08/18 1633 98 %     Weight --      Height --      Head  Circumference --      Peak Flow --      Pain Score 07/08/18 1636 8     Pain Loc --      Pain Edu? --      Excl. in GC? --    No data found.  Updated Vital Signs BP (!) 122/58 (BP Location: Right Arm)   Pulse 63   Temp 98.2 F (36.8 C) (Oral)   Resp 18   LMP 07/01/2018   SpO2 98%   Visual Acuity Right Eye Distance:   Left Eye Distance:   Bilateral Distance:    Right Eye Near:   Left Eye Near:    Bilateral Near:     Physical Exam  Constitutional: She is oriented to person, place, and time. She appears well-developed and well-nourished.  Pulmonary/Chest: Effort normal.  Neurological: She is alert and oriented to person, place, and time.  Skin: Skin is warm and dry. Capillary refill takes less than 2 seconds.  Swelling surrounding nailbed of left great toe.  Surrounding erythema and very  tender to touch.  Psychiatric: She has a normal mood and affect.  Nursing note and vitals reviewed.    UC Treatments / Results  Labs (all labs ordered are listed, but only abnormal results are displayed) Labs Reviewed - No data to display  EKG None  Radiology No results found.  Procedures Procedures (including critical care time)  Medications Ordered in UC Medications - No data to display  Initial Impression / Assessment and Plan / UC Course  I have reviewed the triage vital signs and the nursing notes.  Pertinent labs & imaging results that were available during my care of the patient were reviewed by me and considered in my medical decision making (see chart for details).     We will treat for infection in the left great toe with Keflex.  Continue Epson salt soaks and follow-up with podiatry as scheduled in 1 month. Final Clinical Impressions(s) / UC Diagnoses   Final diagnoses:  Ingrown left big toenail     Discharge Instructions     It was nice meeting you!!  I am treating you for the infection in your toe.  Please keep you appointment with the podiatrist to have the ingrown toenail removed.  Continue the Epson salt soaks as you have been .  Return as needed.     ED Prescriptions    Medication Sig Dispense Auth. Provider   cephALEXin (KEFLEX) 500 MG capsule Take 1 capsule (500 mg total) by mouth 4 (four) times daily. 40 capsule Helvi Royals A, NP   naproxen (NAPROSYN) 500 MG tablet Take 1 tablet (500 mg total) by mouth 2 (two) times daily with a meal. 30 tablet Tiajah Oyster A, NP     Controlled Substance Prescriptions [place] Controlled Substance Registry consulted? Not Applicable   Janace Aris, NP 07/08/18 1909

## 2018-08-28 ENCOUNTER — Ambulatory Visit: Payer: Self-pay

## 2018-09-03 DIAGNOSIS — F1120 Opioid dependence, uncomplicated: Secondary | ICD-10-CM | POA: Insufficient documentation

## 2018-09-29 ENCOUNTER — Encounter (HOSPITAL_COMMUNITY): Payer: Self-pay | Admitting: Emergency Medicine

## 2018-09-29 ENCOUNTER — Ambulatory Visit (INDEPENDENT_AMBULATORY_CARE_PROVIDER_SITE_OTHER): Payer: Self-pay

## 2018-09-29 ENCOUNTER — Ambulatory Visit (HOSPITAL_COMMUNITY)
Admission: EM | Admit: 2018-09-29 | Discharge: 2018-09-29 | Disposition: A | Discharge status: Home / Self Care | Payer: Self-pay | Attending: Family Medicine | Admitting: Family Medicine

## 2018-09-29 DIAGNOSIS — K5900 Constipation, unspecified: Secondary | ICD-10-CM

## 2018-09-29 MED ORDER — DOCUSATE SODIUM 100 MG PO CAPS: 100.0000 mg | ORAL_CAPSULE | Freq: Two times a day (BID) | ORAL | 0 refills | Status: DC

## 2018-09-29 MED ORDER — BISACODYL 10 MG RE SUPP: 10.0000 mg | Freq: Every day | RECTAL | 0 refills | Status: DC | PRN

## 2018-09-29 NOTE — ED Triage Notes (Signed)
PT reports abdominal pain and cramping pains for a week. PT reports nausea as well. PT usually has a BM every other day. PT reports a BM yesterday and a BM 3-4 days prior.

## 2018-09-29 NOTE — Discharge Instructions (Addendum)
Please use Miralax for moderate to severe constipation. Take this once a day for the next 2-3 days. Please also start docusate stool softener, twice a day for at least 1 week. If stools become loose, cut down to once a day for another week. If stools remain loose, cut back to 1 pill every other day for a third week. You can stop docusate thereafter and resume as needed for constipation.  May use suppository provided once daily as needed to help with constipation, please insert rectally  As alternative you may try a cleanout with MiraLAX docusate, take 1 tablet of docusate, 1 capful of MiraLAX, take a second capful 30 minutes to 1 hour later, if you have not begin to have bowel movement.  Repeat in another 30 min to 1-hour.  Please do not take more than this.  To help reduce constipation and promote bowel health: 1. Drink at least 64 ounces of water each day 2. Eat plenty of fiber (fruits, vegetables, whole grains, legumes) 3. Be physically active or exercise including walking, jogging, swimming, yoga, etc. 4. For active constipation use a stool softener (docusate) or an osmotic laxative (like Miralax) each day, or as needed.

## 2018-09-30 NOTE — ED Provider Notes (Signed)
[facility]    CSN: [id number] Arrival date & time: 09/29/18  1338     History   Chief Complaint Chief Complaint  Patient presents with  . Constipation    HPI Felicia Costa is a 37 y.o. female history of hypertension and opioid dependence using Subutex presenting today for evaluation of abdominal bloating and constipation.  Patient states that over the past 3 to 4 days she has had difficulty having bowel movements.  She typically has a bowel movement every other day, but of recently she has had less frequent bowel movements.  She has had to strain.  She did have a bowel movement yesterday after using MiraLAX, but still feels the need to use the restroom.  She is also tried mag citrate earlier today and feels this worsened her abdominal discomfort and bloating.  She has had some nausea, but denies vomiting.  Denies fevers.  Denies associated URI symptoms.  Patient is concerned that she has a family history of her mother having polyps and aunt passing away from colon cancer after "not having a bowel movement for a month".  Patient denies bleeding with bowel movements.  Patient has not seen gastroenterology.  HPI  Past Medical History:  Diagnosis Date  . Hypertension     There are no active problems to display for this patient.   History reviewed. No pertinent surgical history.  OB History   None      Home Medications    Prior to Admission medications   Medication Sig Start Date End Date Taking? Authorizing Provider  buprenorphine (SUBUTEX) 2 MG SUBL SL tablet Place under the tongue daily.   Yes [provider]  gabapentin (NEURONTIN) 600 MG tablet Take 600 mg by mouth 3 (three) times daily.   Yes [provider]  propranolol (INDERAL) 10 MG tablet Take 10 mg by mouth 3 (three) times daily.   Yes [provider]  bisacodyl (DULCOLAX) 10 MG suppository Place 1 suppository (10 mg total) rectally daily as needed for moderate  constipation. 09/29/18   Keyira Mondesir C, PA-C  docusate sodium (COLACE) 100 MG capsule Take 1 capsule (100 mg total) by mouth every 12 (twelve) hours. 09/29/18   Andrey Hoobler C, PA-C  ibuprofen (ADVIL,MOTRIN) 200 MG tablet Take 800 mg by mouth every 6 (six) hours as needed for moderate pain.     [provider]  methocarbamol (ROBAXIN) 500 MG tablet Take 1 tablet (500 mg total) by mouth 2 (two) times daily. Patient not taking: Reported on 09/22/2016 11/23/15   Garlon Hatchet, PA-C  naproxen (NAPROSYN) 500 MG tablet Take 1 tablet (500 mg total) by mouth 2 (two) times daily with a meal. 07/08/18   Bast, Traci A, NP  oxyCODONE-acetaminophen (PERCOCET/ROXICET) 5-325 MG per tablet Take 2 tablets by mouth every 6 (six) hours as needed for severe pain. Patient not taking: Reported on 11/23/2015 04/09/15   Roxy Horseman, PA-C    Family History Family History  Adopted: Yes  Family history unknown: Yes    Social History Social History   Tobacco Use  . Smoking status: Former Smoker    Packs/day: 2.00    Types: Cigarettes  Substance Use Topics  . Alcohol use: No  . Drug use: No     Allergies   Geodon [ziprasidone hcl]; Bactrim [sulfamethoxazole-trimethoprim]; and Narcan [naloxone]   Review of Systems Review of Systems  Constitutional: Negative for activity change, appetite change, chills, fatigue and fever.  HENT: Negative for congestion, ear  pain, rhinorrhea, sinus pressure, sore throat and trouble swallowing.   Eyes: Negative for discharge and redness.  Respiratory: Negative for cough, chest tightness and shortness of breath.   Cardiovascular: Negative for chest pain.  Gastrointestinal: Positive for abdominal pain and constipation. Negative for blood in stool, diarrhea, nausea and vomiting.  Musculoskeletal: Negative for myalgias.  Skin: Negative for rash.  Neurological: Negative for dizziness, light-headedness and headaches.     Physical Exam Triage Vital  Signs ED Triage Vitals  Enc Vitals Group     BP 09/29/18 1414 107/74     Pulse Rate 09/29/18 1413 92     Resp 09/29/18 1413 16     Temp 09/29/18 1413 98.1 F (36.7 C)     Temp Source 09/29/18 1413 Oral     SpO2 09/29/18 1413 98 %     Weight 09/29/18 1411 145 lb (65.8 kg)     Height --      Head Circumference --      Peak Flow --      Pain Score 09/29/18 1411 3     Pain Loc --      Pain Edu? --      Excl. in GC? --    No data found.  Updated Vital Signs BP 107/74   Pulse 92   Temp 98.1 F (36.7 C) (Oral)   Resp 16   Wt 145 lb (65.8 kg)   LMP 09/22/2018   SpO2 98%   BMI 25.69 kg/m   Visual Acuity Right Eye Distance:   Left Eye Distance:   Bilateral Distance:    Right Eye Near:   Left Eye Near:    Bilateral Near:     Physical Exam  Constitutional: She appears well-developed and well-nourished. No distress.  HENT:  Head: Normocephalic and atraumatic.  Eyes: Conjunctivae are normal.  Neck: Neck supple.  Cardiovascular: Normal rate and regular rhythm.  No murmur heard. Pulmonary/Chest: Effort normal. No respiratory distress.  Breathing comfortably at rest, CTABL, wheezing throughout bilateral lungs fields, no rales or other adventitious sounds auscultated  Abdominal: Soft. There is tenderness.  Abdomen soft, dull to percussion in right upper quadrant,  nondistended, mild discomfort to palpation throughout abdomen, negative rebound, negative Rovsing's, negative McBurney's;   Musculoskeletal: She exhibits no edema.  Neurological: She is alert.  Skin: Skin is warm and dry.  Psychiatric: She has a normal mood and affect.  Nursing note and vitals reviewed.    UC Treatments / Results  Labs (all labs ordered are listed, but only abnormal results are displayed) Labs Reviewed - No data to display  EKG None  Radiology Dg Abd 2 Views  Result Date: 09/29/2018 CLINICAL DATA:  Three days of abdominal cramping with bloating, gas, nausea, and irregular bowel  movements. EXAM: ABDOMEN - 2 VIEW COMPARISON:  No recent studies in PACs FINDINGS: The colonic and rectal stool burdens are moderately increased. There is no small or large bowel obstructive pattern. There is fluid and air within the stomach. There are no abnormal soft tissue calcifications. The bony structures are unremarkable. IMPRESSION: Moderately increase colonic and rectal stool burdens may reflect constipation in the appropriate clinical setting. No evidence of bowel obstruction or ileus. Electronically Signed   By: David  [place] M.D.   On: 09/29/2018 15:05    Procedures Procedures (including critical care time)  Medications Ordered in UC Medications - No data to display  Initial Impression / Assessment and Plan / UC Course  I have reviewed the triage vital  signs and the nursing notes.  Pertinent labs & imaging results that were available during my care of the patient were reviewed by me and considered in my medical decision making (see chart for details).     Abdominal x-ray obtained given to confirm diagnosis of constipation is patient using MiraLAX and mag citrate without relief.  X-ray shows increase stool burden and colon and rectum.  We will continue to treat for constipation, discussed with patient using enemas/suppositories given stool in the distal colon.  As alternative she may try a cleanout with MiraLAX.  Stressed lifestyle modifications for constipation, and discussed daily maintenance regimen for the patient and after cleanout/suppositories.  Discussed strict return precautions. Patient verbalized understanding and is agreeable with plan.  Final Clinical Impressions(s) / UC Diagnoses   Final diagnoses:  Constipation, unspecified constipation type     Discharge Instructions     Please use Miralax for moderate to severe constipation. Take this once a day for the next 2-3 days. Please also start docusate stool softener, twice a day for at least 1 week. If stools become  loose, cut down to once a day for another week. If stools remain loose, cut back to 1 pill every other day for a third week. You can stop docusate thereafter and resume as needed for constipation.  May use suppository provided once daily as needed to help with constipation, please insert rectally  As alternative you may try a cleanout with MiraLAX docusate, take 1 tablet of docusate, 1 capful of MiraLAX, take a second capful 30 minutes to 1 hour later, if you have not begin to have bowel movement.  Repeat in another 30 min to 1-hour.  Please do not take more than this.  To help reduce constipation and promote bowel health: 1. Drink at least 64 ounces of water each day 2. Eat plenty of fiber (fruits, vegetables, whole grains, legumes) 3. Be physically active or exercise including walking, jogging, swimming, yoga, etc. 4. For active constipation use a stool softener (docusate) or an osmotic laxative (like Miralax) each day, or as needed.   ED Prescriptions    Medication Sig Dispense Auth. Provider   docusate sodium (COLACE) 100 MG capsule Take 1 capsule (100 mg total) by mouth every 12 (twelve) hours. 60 capsule Amiir Heckard C, PA-C   bisacodyl (DULCOLAX) 10 MG suppository Place 1 suppository (10 mg total) rectally daily as needed for moderate constipation. 12 suppository Smith Potenza, [place] C, PA-C     Controlled Substance Prescriptions [place] Controlled Substance Registry consulted? Not Applicable   Lew Dawes, [place] 09/30/18 1024

## 2018-11-05 ENCOUNTER — Ambulatory Visit: Payer: Self-pay | Admitting: Gastroenterology

## 2018-12-23 ENCOUNTER — Emergency Department (HOSPITAL_COMMUNITY): Payer: Self-pay

## 2018-12-23 ENCOUNTER — Emergency Department (HOSPITAL_COMMUNITY)
Admission: EM | Admit: 2018-12-23 | Discharge: 2018-12-23 | Disposition: A | Discharge status: Home / Self Care | Payer: Self-pay | Attending: Emergency Medicine | Admitting: Emergency Medicine

## 2018-12-23 DIAGNOSIS — I10 Essential (primary) hypertension: Secondary | ICD-10-CM | POA: Insufficient documentation

## 2018-12-23 DIAGNOSIS — Z634 Disappearance and death of family member: Secondary | ICD-10-CM | POA: Insufficient documentation

## 2018-12-23 DIAGNOSIS — F410 Panic disorder [episodic paroxysmal anxiety] without agoraphobia: Secondary | ICD-10-CM

## 2018-12-23 DIAGNOSIS — Z87891 Personal history of nicotine dependence: Secondary | ICD-10-CM | POA: Insufficient documentation

## 2018-12-23 DIAGNOSIS — Z79899 Other long term (current) drug therapy: Secondary | ICD-10-CM | POA: Insufficient documentation

## 2018-12-23 LAB — I-STAT CHEM 8, ED
BUN: 11 mg/dL (ref 6–20)
Calcium, Ion: 1.15 mmol/L (ref 1.15–1.40)
Chloride: 104 mmol/L (ref 98–111)
Creatinine, Ser: 0.6 mg/dL (ref 0.44–1.00)
Glucose, Bld: 118 mg/dL — ABNORMAL HIGH (ref 70–99)
HCT: 49 % — ABNORMAL HIGH (ref 36.0–46.0)
Hemoglobin: 16.7 g/dL — ABNORMAL HIGH (ref 12.0–15.0)
Potassium: 3.6 mmol/L (ref 3.5–5.1)
SODIUM: 140 mmol/L (ref 135–145)
TCO2: 26 mmol/L (ref 22–32)

## 2018-12-23 LAB — RAPID URINE DRUG SCREEN, HOSP PERFORMED
Amphetamines: NOT DETECTED
Barbiturates: NOT DETECTED
Benzodiazepines: POSITIVE — AB
Cocaine: NOT DETECTED
Opiates: NOT DETECTED
Tetrahydrocannabinol: POSITIVE — AB

## 2018-12-23 LAB — TSH: TSH: 0.956 u[IU]/mL (ref 0.350–4.500)

## 2018-12-23 LAB — I-STAT BETA HCG BLOOD, ED (MC, WL, AP ONLY)

## 2018-12-23 LAB — CBC
HCT: 47.7 % — ABNORMAL HIGH (ref 36.0–46.0)
Hemoglobin: 15.3 g/dL — ABNORMAL HIGH (ref 12.0–15.0)
MCH: 29.8 pg (ref 26.0–34.0)
MCHC: 32.1 g/dL (ref 30.0–36.0)
MCV: 92.8 fL (ref 80.0–100.0)
Platelets: 326 10*3/uL (ref 150–400)
RBC: 5.14 MIL/uL — AB (ref 3.87–5.11)
RDW: 13 % (ref 11.5–15.5)
WBC: 6.4 10*3/uL (ref 4.0–10.5)
nRBC: 0 % (ref 0.0–0.2)

## 2018-12-23 LAB — D-DIMER, QUANTITATIVE (NOT AT ARMC): D DIMER QUANT: 0.38 ug{FEU}/mL (ref 0.00–0.50)

## 2018-12-23 LAB — I-STAT TROPONIN, ED: Troponin i, poc: 0 ng/mL (ref 0.00–0.08)

## 2018-12-23 MED ORDER — HYDROXYZINE HCL 25 MG PO TABS: 25.0000 mg | ORAL_TABLET | Freq: Three times a day (TID) | ORAL | 0 refills | Status: DC | PRN

## 2018-12-23 MED ORDER — LORAZEPAM 2 MG/ML IJ SOLN
1.0000 mg | Freq: Once | INTRAMUSCULAR | Status: AC
  Administered 2018-12-23: 1 mg via INTRAVENOUS
  Filled 2018-12-23: qty 1

## 2018-12-23 NOTE — ED Triage Notes (Signed)
Pt reports that her mother died here a month ago and pt is very anxious and didn't want to sit in triage chair to get EKG and vital signs. Allowed pt to have few minutes before we had to get her to sit in chair to get EKG. Pt reports that she had chest pains and heart racing since Dec 31. Reports she recently had a lot of things going on that are new and very stressful and making her anxiety very high. Reports she sees a PCP and wanted her to come to ED for further heart work up. Denies having a cardiologist in [place]. Reports HX SVT.

## 2018-12-23 NOTE — ED Provider Notes (Signed)
[facility]EMERGENCY [facility]PT Provider Note   CSN: [id number] Arrival date & time: 12/23/18  0759     History   Chief Complaint Chief Complaint  Patient presents with  . Chest Pain  . Tachycardia    HPI Felicia Costa is a 38 y.o. female.  HPI Patient reports "I am having anxiety attacks" she reports that she is been extremely anxious since her mother died last month.  She has been extremely anxious.  She gets chest pain from time to time which lasts a few minutes which migrates from 1 part of her chest to another.  Nonexertional.  She denies any shortness of breath.  She reports that she saw her PCP Dr. Salvadore Farberorrington who advised her to come to the emergency department.  No treatment prior to coming here.  No other associated symptoms.  Symptoms made worse when she becomes anxious.  Nothing improves symptoms. Past Medical History:  Diagnosis Date  . Hypertension   SVT diagnosed at age 38.  Saw a cardiologist in [place] at age 38 however not since Recovered opioid addict not for several years There are no active problems to display for this patient.   No past surgical history on file.   OB History   No obstetric history on file.      Home Medications    Prior to Admission medications   Medication Sig Start Date End Date Taking? Authorizing Provider  bisacodyl (DULCOLAX) 10 MG suppository Place 1 suppository (10 mg total) rectally daily as needed for moderate constipation. 09/29/18   Wieters, Hallie C, PA-C  buprenorphine (SUBUTEX) 2 MG SUBL SL tablet Place under the tongue daily.    [provider]  docusate sodium (COLACE) 100 MG capsule Take 1 capsule (100 mg total) by mouth every 12 (twelve) hours. 09/29/18   Wieters, Hallie C, PA-C  gabapentin (NEURONTIN) 600 MG tablet Take 600 mg by mouth 3 (three) times daily.    [provider]  ibuprofen (ADVIL,MOTRIN) 200 MG tablet Take 800 mg by mouth every 6 (six) hours as needed for moderate  pain.     [provider]  methocarbamol (ROBAXIN) 500 MG tablet Take 1 tablet (500 mg total) by mouth 2 (two) times daily. Patient not taking: Reported on 09/22/2016 11/23/15   Garlon HatchetSanders, Lisa M, PA-C  naproxen (NAPROSYN) 500 MG tablet Take 1 tablet (500 mg total) by mouth 2 (two) times daily with a meal. 07/08/18   Bast, Traci A, NP  oxyCODONE-acetaminophen (PERCOCET/ROXICET) 5-325 MG per tablet Take 2 tablets by mouth every 6 (six) hours as needed for severe pain. Patient not taking: Reported on 11/23/2015 04/09/15   Roxy HorsemanBrowning, Robert, PA-C  propranolol (INDERAL) 10 MG tablet Take 10 mg by mouth 3 (three) times daily.    [provider]    Family History Family History  Adopted: Yes  Family history unknown: Yes    Social History Social History   Tobacco Use  . Smoking status: Former Smoker    Packs/day: 2.00    Types: Cigarettes  Substance Use Topics  . Alcohol use: No  . Drug use: No     Allergies   Geodon [ziprasidone hcl]; Bactrim [sulfamethoxazole-trimethoprim]; and Narcan [naloxone]   Review of Systems Review of Systems  Constitutional: Negative.   HENT: Negative.   Respiratory: Negative.   Cardiovascular: Positive for chest pain.  Gastrointestinal: Negative.   Musculoskeletal: Negative.   Skin: Negative.   Neurological: Negative.   Psychiatric/Behavioral: The patient is nervous/anxious.  All other systems reviewed and are negative.    Physical Exam Updated Vital Signs BP (!) 113/94 (BP Location: Left Arm)   Pulse (!) 145   Temp 99.1 F (37.3 C) (Oral)   Resp (!) 24   Ht 5' 2.25" (1.581 m)   Wt 65.8 kg   LMP 01/14/2018   SpO2 100%   BMI 26.31 kg/m   Physical Exam Vitals signs and nursing note reviewed.  Constitutional:      Appearance: She is well-developed.     Comments: Appears anxious  HENT:     Head: Normocephalic and atraumatic.  Eyes:     Conjunctiva/sclera: Conjunctivae normal.     Pupils: Pupils are equal, round, and  reactive to light.  Neck:     Musculoskeletal: Neck supple.     Thyroid: No thyromegaly.     Trachea: No tracheal deviation.  Cardiovascular:     Rate and Rhythm: Regular rhythm.     Heart sounds: No murmur.     Comments: Tachycardic Pulmonary:     Effort: Pulmonary effort is normal.     Breath sounds: Normal breath sounds.  Abdominal:     General: Bowel sounds are normal. There is no distension.     Palpations: Abdomen is soft.     Tenderness: There is no abdominal tenderness.  Musculoskeletal: Normal range of motion.        General: No tenderness.  Skin:    General: Skin is warm and dry.     Capillary Refill: Capillary refill takes less than 2 seconds.     Findings: No rash.  Neurological:     Mental Status: She is alert.     Coordination: Coordination normal.  Psychiatric:     Comments: Anxious appearing      ED Treatments / Results  Labs (all labs ordered are listed, but only abnormal results are displayed) Labs Reviewed  CBC  RAPID URINE DRUG SCREEN, HOSP PERFORMED  TSH  D-DIMER, QUANTITATIVE (NOT AT [facility])  I-STAT TROPONIN, ED  I-STAT BETA HCG BLOOD, ED ([facility], [facility], [facility] ONLY)  I-STAT CHEM 8, ED    EKG EKG Interpretation  Date/Time:  Wednesday December 23 2018 08:10:29 EST Ventricular Rate:  142 PR Interval:    QRS Duration: 86 QT Interval:  293 QTC Calculation: 451 R Axis:   -20 Text Interpretation:  Sinus tachycardia Consider right atrial enlargement Borderline left axis deviation SINCE LAST TRACING HEART RATE HAS INCREASED Confirmed by Doug Sou [phone]) on 12/23/2018 8:14:43 AM   Radiology No results found.  Procedures Procedures (including critical care time)  Medications Ordered in ED Medications  LORazepam (ATIVAN) injection 1 mg (has no administration in time range)    Chest x-ray viewed by me Results for orders placed or performed during the hospital encounter of 12/23/18  CBC  Result Value Ref Range   WBC 6.4 4.0 - 10.5 K/uL   RBC  5.14 (H) 3.87 - 5.11 MIL/uL   Hemoglobin 15.3 (H) 12.0 - 15.0 g/dL   HCT 13.0 (H) 86.5 - 78.4 %   MCV 92.8 80.0 - 100.0 fL   MCH 29.8 26.0 - 34.0 pg   MCHC 32.1 30.0 - 36.0 g/dL   RDW 69.6 29.5 - 28.4 %   Platelets 326 150 - 400 K/uL   nRBC 0.0 0.0 - 0.2 %  Rapid urine drug screen (hospital performed)  Result Value Ref Range   Opiates NONE DETECTED NONE DETECTED   Cocaine NONE DETECTED NONE DETECTED   Benzodiazepines  POSITIVE (A) NONE DETECTED   Amphetamines NONE DETECTED NONE DETECTED   Tetrahydrocannabinol POSITIVE (A) NONE DETECTED   Barbiturates NONE DETECTED NONE DETECTED  TSH  Result Value Ref Range   TSH 0.956 0.350 - 4.500 uIU/mL  D-dimer, quantitative (not at [facility])  Result Value Ref Range   D-Dimer, Quant 0.38 0.00 - 0.50 ug/mL-FEU  I-stat troponin, ED  Result Value Ref Range   Troponin i, poc 0.00 0.00 - 0.08 ng/mL   Comment 3          I-Stat beta hCG blood, ED  Result Value Ref Range   I-stat hCG, quantitative <5.0 <5 mIU/mL   Comment 3          I-stat chem 8, ed  Result Value Ref Range   Sodium 140 135 - 145 mmol/L   Potassium 3.6 3.5 - 5.1 mmol/L   Chloride 104 98 - 111 mmol/L   BUN 11 6 - 20 mg/dL   Creatinine, Ser 0.07 0.44 - 1.00 mg/dL   Glucose, Bld 121 (H) 70 - 99 mg/dL   Calcium, Ion 9.75 8.83 - 1.40 mmol/L   TCO2 26 22 - 32 mmol/L   Hemoglobin 16.7 (H) 12.0 - 15.0 g/dL   HCT 25.4 (H) 98.2 - 64.1 %   Dg Chest 2 View  Result Date: 12/23/2018 CLINICAL DATA:  Anxiety with chest pain EXAM: CHEST - 2 VIEW COMPARISON:  None. FINDINGS: Normal heart size and mediastinal contours. No acute infiltrate or edema. No effusion or pneumothorax. No acute osseous findings. IMPRESSION: Negative chest. Electronically Signed   By: Marnee Spring M.D.   On: 12/23/2018 09:15   Initial Impression / Assessment and Plan / ED Course  I have reviewed the triage vital signs and the nursing notes.  Pertinent labs & imaging results that were available during my care of the  patient were reviewed by me and considered in my medical decision making (see chart for details).     Patient felt immediately better after treatment with intravenous lorazepam.  Her heart rate decreased to 75 bpm, normal sinus rhythm.   I feel the patient's symptoms are due entirely to anxiety.  She reports her mother died 1 month ago today. Low pretest probability for pulmonary embolism.  Negative d-dimer. I will prescribe Atarax for anxiety as patient has THC in urine.  Also is chronically on buprenorphine and combination of benzodiazepines for anxiety with buprenorphine is potentially dangerous.  She has appointment with psychiatrist scheduled for January 01, 2018.  Follow-up with her PCP between now and 01/01/2019 if needed.  Or she can return to the emergency department if for any concern Final Clinical Impressions(s) / ED Diagnoses  Diagnosis panic attacks Final diagnoses:  None    ED Discharge Orders    None       Doug Sou, MD 12/23/18 1014

## 2018-12-23 NOTE — Discharge Instructions (Signed)
Keep your scheduled appointment with psychiatry later this month.  You can also see Felicia Costa return to the emergency department if you feel that you are not doing well between now and the time you see your psychiatrist.

## 2018-12-23 NOTE — ED Notes (Signed)
Bed: WA06 Expected date:  Expected time:  Means of arrival:  Comments: 

## 2018-12-23 NOTE — ED Notes (Signed)
Patient transported to X-ray 

## 2019-03-15 DIAGNOSIS — B351 Tinea unguium: Secondary | ICD-10-CM | POA: Insufficient documentation

## 2019-09-24 DIAGNOSIS — F411 Generalized anxiety disorder: Secondary | ICD-10-CM | POA: Insufficient documentation

## 2019-10-16 IMAGING — CR DG CHEST 2V
2 series · 2 of 2 positions shown · non-contrast
Comparison: None.

CLINICAL DATA: Anxiety with chest pain

EXAM:
CHEST - 2 VIEW

[w chest pa]
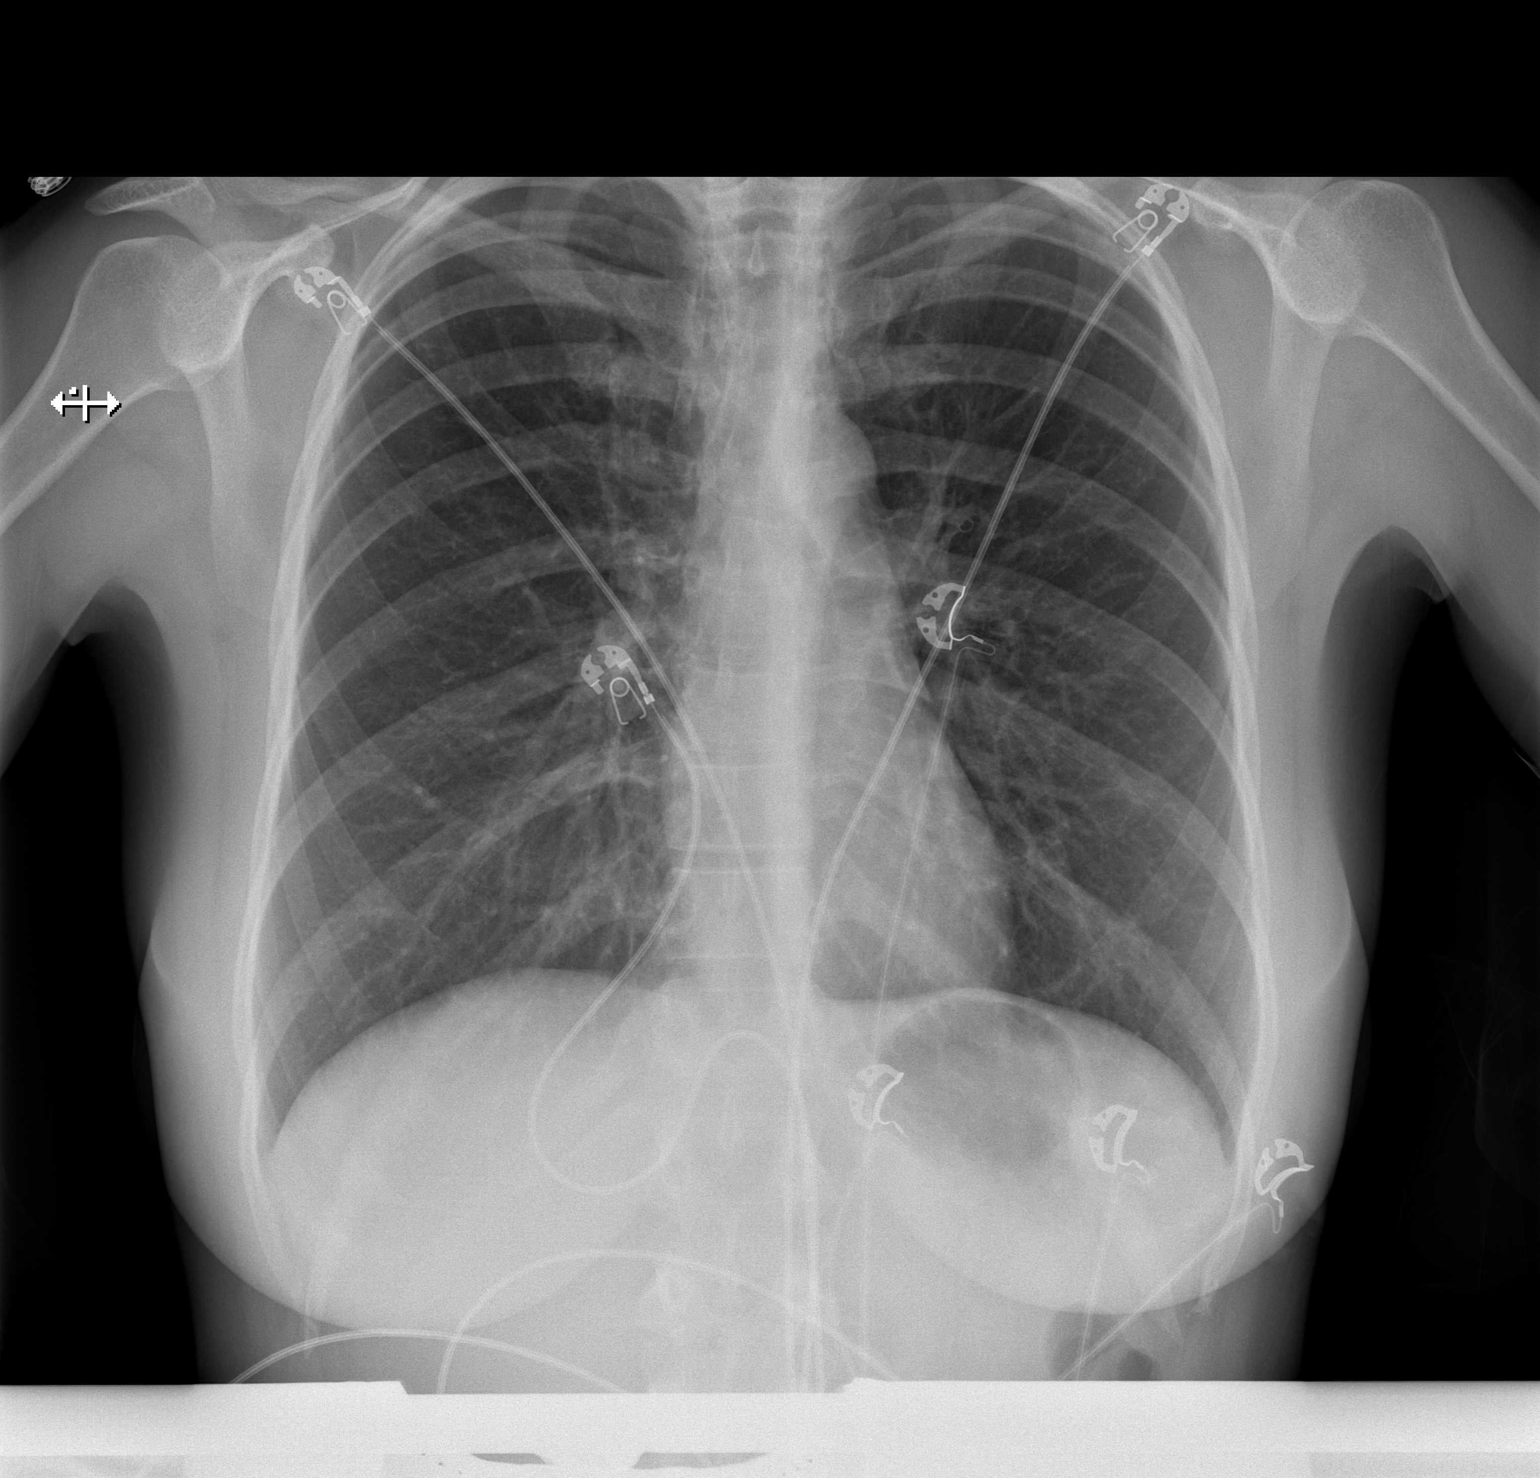

[w chest lat]
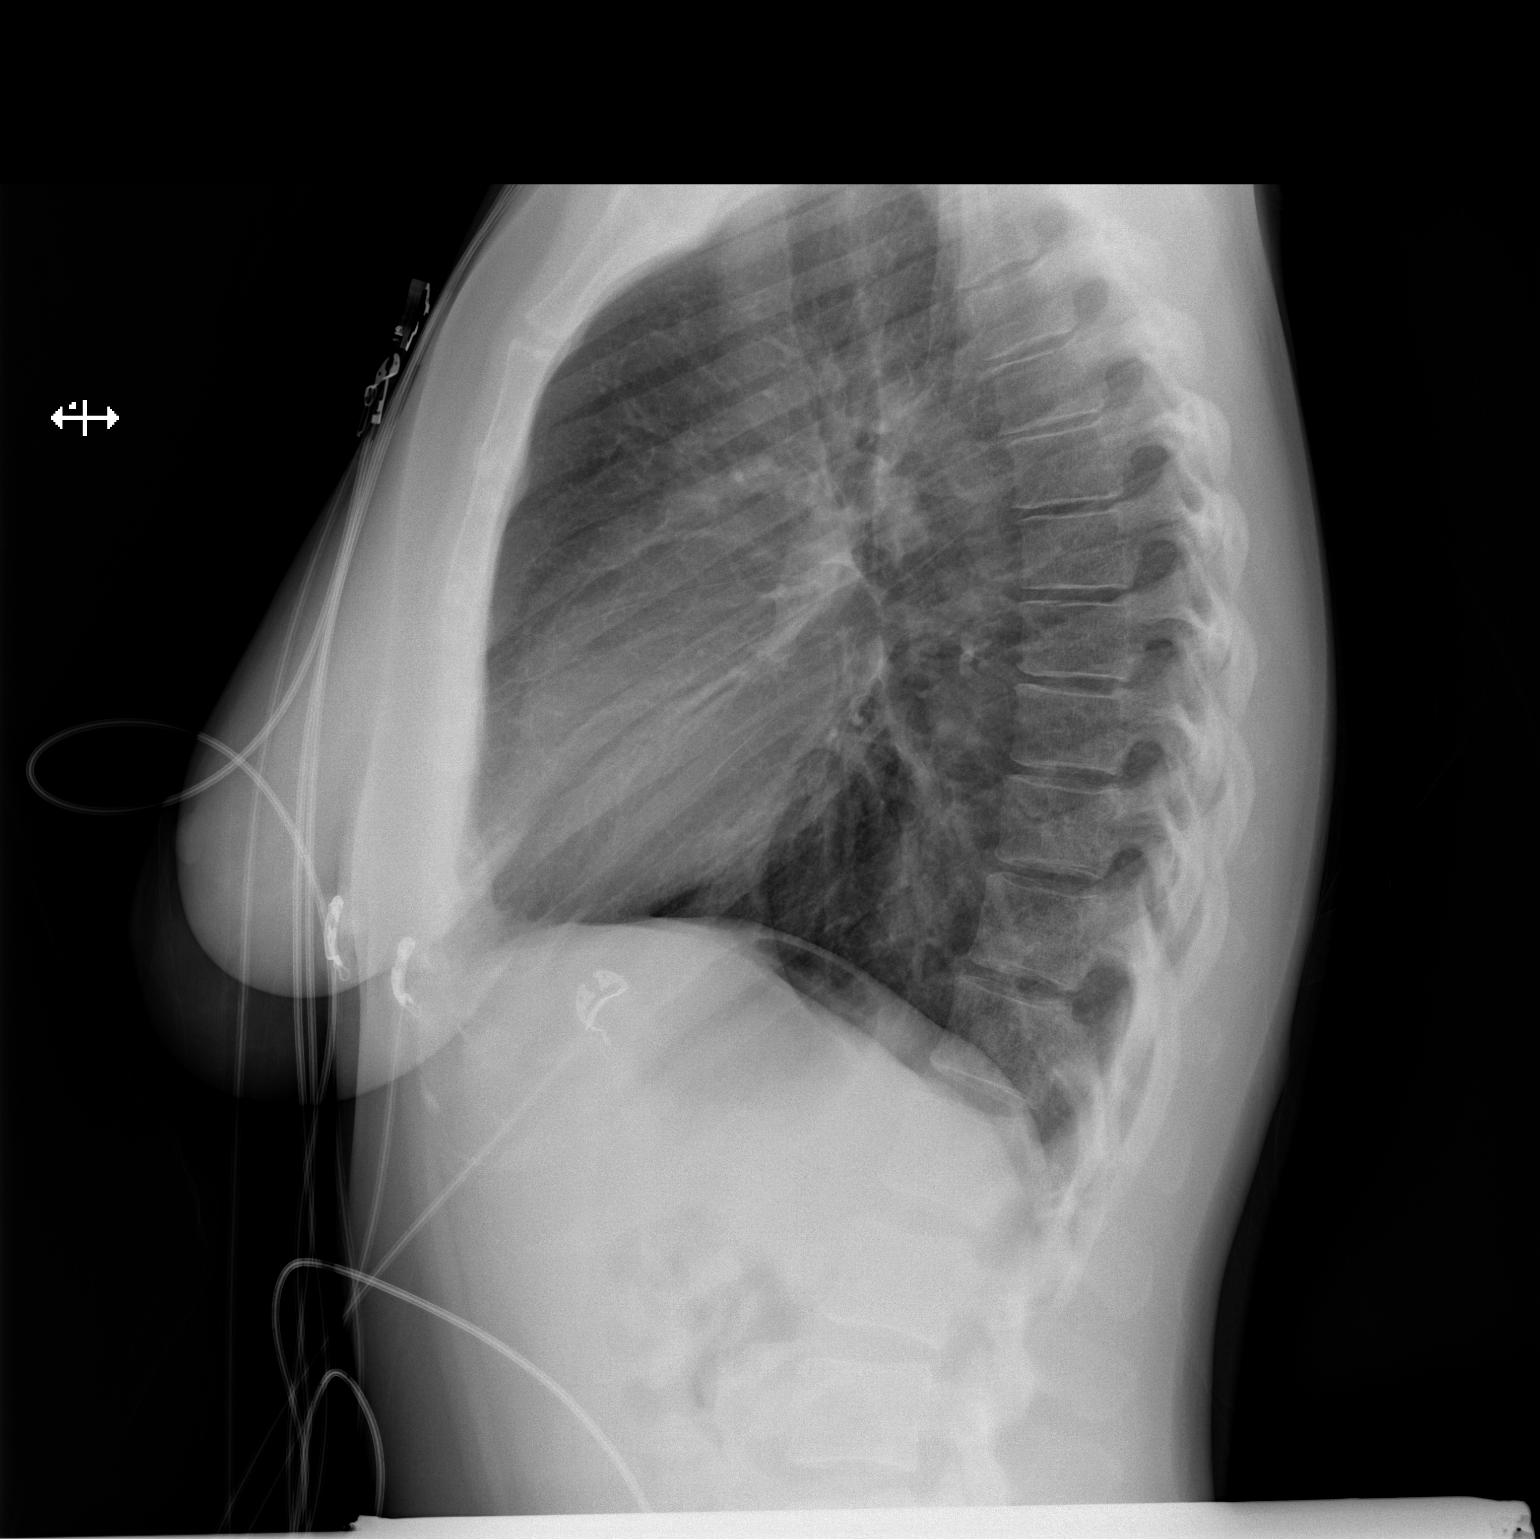

[2 of 2 positions shown; findings below may reference images not displayed]

FINDINGS: Normal heart size and mediastinal contours. No acute infiltrate or
edema. No effusion or pneumothorax. No acute osseous findings.
IMPRESSION: Negative chest.

## 2020-05-05 ENCOUNTER — Ambulatory Visit: Payer: Self-pay | Admitting: Podiatry

## 2020-05-09 ENCOUNTER — Ambulatory Visit: Payer: Self-pay | Admitting: Podiatry

## 2020-05-09 ENCOUNTER — Other Ambulatory Visit: Payer: Self-pay

## 2020-05-09 ENCOUNTER — Encounter: Payer: Self-pay | Admitting: Podiatry

## 2020-05-09 ENCOUNTER — Ambulatory Visit (INDEPENDENT_AMBULATORY_CARE_PROVIDER_SITE_OTHER): Payer: Self-pay | Admitting: Podiatry

## 2020-05-09 DIAGNOSIS — L600 Ingrowing nail: Secondary | ICD-10-CM

## 2020-05-09 DIAGNOSIS — M79674 Pain in right toe(s): Secondary | ICD-10-CM

## 2020-05-09 DIAGNOSIS — L603 Nail dystrophy: Secondary | ICD-10-CM

## 2020-05-16 NOTE — Progress Notes (Signed)
Subjective:   Patient ID: Felicia Costa, female   DOB: 39 y.o.   MRN: [id number]   HPI 39 year old female presents the office today for concerns of her right big toenail becoming thickened discolored.  She presented nail removed and came back and even thicker and more discolored.  Is been ongoing for the last 5 years.  She discharge from the corners.  Nails curving in.  Denies any drainage and denies any redness or swelling.  She also has a history of a injury when she was 39 years old when a horse stepped on the toe.  She has no other concerns today.   Review of Systems  All other systems reviewed and are negative.  Past Medical History:  Diagnosis Date  . Hypertension     History reviewed. No pertinent surgical history.   Current Outpatient Medications:  .  buprenorphine (SUBUTEX) 2 MG SUBL SL tablet, Place 2 mg under the tongue 3 (three) times daily. , Disp: , Rfl:  .  gabapentin (NEURONTIN) 600 MG tablet, Take 600 mg by mouth 3 (three) times daily., Disp: , Rfl:   Allergies  Allergen Reactions  . Geodon [Ziprasidone Hcl] Other (See Comments)    Seizures  . Bactrim [Sulfamethoxazole-Trimethoprim] Swelling  . Narcan [Naloxone] Rash         Objective:  Physical Exam  General: AAO x3, NAD- very anxious   Dermatological: Right hallux toenails hypertrophic, dystrophic with yellow-brown discoloration with mild incurvation of the nail borders.  There is tenderness upon direct palpation of the toenail itself dorsally.  There is no edema, erythema, drainage or pus or any signs of infection.  I think the tenderness is more coming from the thickening of the nail as opposed to just ingrown toenail.  Vascular: Dorsalis Pedis artery and Posterior Tibial artery pedal pulses are 2/4 bilateral with immedate capillary fill time. Pedal hair growth present. No varicosities and no lower extremity edema present bilateral. There is no pain with calf compression, swelling, warmth, erythema.    Neruologic: Grossly intact via light touch bilateral.   Musculoskeletal: No gross boney pedal deformities bilateral. No pain, crepitus, or limitation noted with foot and ankle range of motion bilateral. Muscular strength 5/5 in all groups tested bilateral.  Gait: Unassisted, Nonantalgic.       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  The nails are already been taken off once it came back and thicker.  Due to this we will try to treat the nail conservatively.  Dispensed urea cream for her to try.  I also debrided the nail and also smoothed the nails well.  Should the symptoms continue, remove the nail.  Monitor closely for signs or symptoms of infection.  Return in about 2 months (around 07/09/2020), or if symptoms worsen or fail to improve.  Vivi Barrack DPM

## 2020-06-09 ENCOUNTER — Ambulatory Visit: Payer: Self-pay | Admitting: Podiatry
